# Patient Record
Sex: Male | Born: 1950
Health system: Southern US, Community
[De-identification: ages and names within clinical notes are randomized; demographics above are authoritative.]

## PROBLEM LIST (undated history)

## (undated) DIAGNOSIS — E119 Type 2 diabetes mellitus without complications: Secondary | ICD-10-CM

## (undated) DIAGNOSIS — I10 Essential (primary) hypertension: Secondary | ICD-10-CM

## (undated) DIAGNOSIS — Z8719 Personal history of other diseases of the digestive system: Secondary | ICD-10-CM

## (undated) DIAGNOSIS — D472 Monoclonal gammopathy: Secondary | ICD-10-CM

## (undated) HISTORY — DX: Monoclonal gammopathy: D47.2

## (undated) HISTORY — DX: Personal history of other diseases of the digestive system: Z87.19

## (undated) HISTORY — PX: TONSILLECTOMY: SUR1361

## (undated) HISTORY — DX: Essential (primary) hypertension: I10

## (undated) HISTORY — DX: Type 2 diabetes mellitus without complications: E11.9

---

## 1990-10-06 HISTORY — PX: APPENDECTOMY: SHX54

## 2004-10-06 HISTORY — PX: COLONOSCOPY: SHX5424

## 2006-08-26 ENCOUNTER — Ambulatory Visit: Payer: Self-pay | Admitting: Unknown Physician Specialty

## 2011-04-01 HISTORY — PX: OTHER SURGICAL HISTORY: SHX169

## 2013-01-04 LAB — HM COLONOSCOPY

## 2014-03-07 DIAGNOSIS — L309 Dermatitis, unspecified: Secondary | ICD-10-CM | POA: Insufficient documentation

## 2014-03-07 DIAGNOSIS — E119 Type 2 diabetes mellitus without complications: Secondary | ICD-10-CM | POA: Insufficient documentation

## 2014-03-07 DIAGNOSIS — I10 Essential (primary) hypertension: Secondary | ICD-10-CM | POA: Insufficient documentation

## 2014-03-07 DIAGNOSIS — E785 Hyperlipidemia, unspecified: Secondary | ICD-10-CM | POA: Insufficient documentation

## 2014-03-07 LAB — HEMOGLOBIN A1C: Hgb A1c MFr Bld: 6.4 % — AB (ref 4.0–6.0)

## 2014-03-07 LAB — BASIC METABOLIC PANEL
Creatinine: 1.5 mg/dL — AB (ref <=1.3)
GLUCOSE: 121 mg/dL

## 2014-06-08 LAB — PSA: PSA: 0.8

## 2014-06-08 LAB — LIPID PANEL
CHOLESTEROL: 142 mg/dL (ref 0–200)
HDL: 36 mg/dL (ref 35–70)
LDL Cholesterol: 82 mg/dL
TRIGLYCERIDES: 120 mg/dL (ref 40–160)

## 2015-02-02 DIAGNOSIS — E119 Type 2 diabetes mellitus without complications: Secondary | ICD-10-CM | POA: Insufficient documentation

## 2015-02-02 DIAGNOSIS — G479 Sleep disorder, unspecified: Secondary | ICD-10-CM | POA: Insufficient documentation

## 2015-02-02 DIAGNOSIS — Z0000 Encounter for general adult medical examination without abnormal findings: Secondary | ICD-10-CM | POA: Insufficient documentation

## 2015-02-02 DIAGNOSIS — E7849 Other hyperlipidemia: Secondary | ICD-10-CM | POA: Insufficient documentation

## 2015-02-02 DIAGNOSIS — I10 Essential (primary) hypertension: Secondary | ICD-10-CM | POA: Insufficient documentation

## 2015-05-17 ENCOUNTER — Ambulatory Visit (INDEPENDENT_AMBULATORY_CARE_PROVIDER_SITE_OTHER): Payer: BLUE CROSS/BLUE SHIELD | Admitting: Family Medicine

## 2015-05-17 ENCOUNTER — Encounter: Payer: Self-pay | Admitting: Family Medicine

## 2015-05-17 VITALS — BP 140/80 | HR 80 | Temp 99.2°F | Ht 70.0 in | Wt 202.0 lb

## 2015-05-17 DIAGNOSIS — N410 Acute prostatitis: Secondary | ICD-10-CM

## 2015-05-17 LAB — POCT URINALYSIS DIPSTICK
BILIRUBIN UA: NEGATIVE
Glucose, UA: NEGATIVE
Ketones, UA: NEGATIVE
NITRITE UA: POSITIVE
PH UA: 6
Spec Grav, UA: 1.015
Urobilinogen, UA: 0.2

## 2015-05-17 MED ORDER — CIPROFLOXACIN HCL 500 MG PO TABS: 500.0000 mg | ORAL_TABLET | Freq: Two times a day (BID) | ORAL | Status: DC

## 2015-05-17 NOTE — Progress Notes (Signed)
Name: Chris Blair.   MRN: [id number]    DOB: 11/25/1950   Date:05/17/2015       Progress Note  Subjective  Chief Complaint  Chief Complaint  Patient presents with  . Urinary Tract Infection    burns when urinate- no blood noticed x 1 day    Urinary Tract Infection  This is a recurrent problem. The current episode started yesterday. The problem has been gradually worsening. The quality of the pain is described as burning. The maximum temperature recorded prior to his arrival was 100 - 100.9 F. The fever has been present for 1 - 2 days. Associated symptoms include chills, frequency, hesitancy, sweats and urgency. Pertinent negatives include no discharge, flank pain, hematuria, nausea or vomiting. He has tried nothing for the symptoms. The treatment provided no relief. There is no history of catheterization or recurrent UTIs.    No problem-specific assessment & plan notes found for this encounter.   No past medical history on file.  Past Surgical History  Procedure Laterality Date  . Appendectomy  10/06/1990  . Partial colon removed- diverticular disease  04/01/2011    No family history on file.  Social History   Social History  . Marital Status: Married    Spouse Name: N/A  . Number of Children: N/A  . Years of Education: N/A   Occupational History  . Not on file.   Social History Main Topics  . Smoking status: Never Smoker   . Smokeless tobacco: Not on file  . Alcohol Use: 0.0 oz/week    0 Standard drinks or equivalent per week  . Drug Use: No  . Sexual Activity: Not on file   Other Topics Concern  . Not on file   Social History Narrative    Allergies  Allergen Reactions  . Penicillins      Review of Systems  Constitutional: Positive for chills. Negative for fever, weight loss and malaise/fatigue.  HENT: Negative for ear discharge, ear pain and sore throat.   Eyes: Negative for blurred vision.  Respiratory: Negative for cough, sputum production,  shortness of breath and wheezing.   Cardiovascular: Negative for chest pain, palpitations and leg swelling.  Gastrointestinal: Negative for heartburn, nausea, vomiting, abdominal pain, diarrhea, constipation, blood in stool and melena.  Genitourinary: Positive for dysuria, hesitancy, urgency and frequency. Negative for hematuria and flank pain.  Musculoskeletal: Negative for myalgias, back pain, joint pain and neck pain.  Skin: Negative for rash.  Neurological: Negative for dizziness, tingling, sensory change, focal weakness and headaches.  Endo/Heme/Allergies: Negative for environmental allergies and polydipsia. Does not bruise/bleed easily.  Psychiatric/Behavioral: Negative for depression and suicidal ideas. The patient is not nervous/anxious and does not have insomnia.      Objective  Filed Vitals:   05/17/15 1136  BP: 140/80  Pulse: 80  Temp: 99.2 F (37.3 C)  TempSrc: Oral  Height: 5\' 10"  (1.778 m)  Weight: 202 lb (91.627 kg)    Physical Exam  Constitutional: He is oriented to person, place, and time and well-developed, well-nourished, and in no distress.  HENT:  Head: Normocephalic.  Right Ear: External ear normal.  Left Ear: External ear normal.  Nose: Nose normal.  Mouth/Throat: Oropharynx is clear and moist.  Eyes: Conjunctivae and EOM are normal. Pupils are equal, round, and reactive to light. Right eye exhibits no discharge. Left eye exhibits no discharge. No scleral icterus.  Neck: Normal range of motion. Neck supple. No JVD present. No tracheal deviation present. No thyromegaly  present.  Cardiovascular: Normal rate, regular rhythm, normal heart sounds and intact distal pulses.  Exam reveals no gallop and no friction rub.   No murmur heard. Pulmonary/Chest: Breath sounds normal. No respiratory distress. He has no wheezes. He has no rales.  Abdominal: Soft. Bowel sounds are normal. He exhibits no mass. There is no hepatosplenomegaly. There is no tenderness. There is  no rebound, no guarding and no CVA tenderness.  Genitourinary: Rectum normal and testes/scrotum normal. Prostate is tender. Prostate is not enlarged.  Musculoskeletal: Normal range of motion. He exhibits no edema or tenderness.  Lymphadenopathy:    He has no cervical adenopathy.  Neurological: He is alert and oriented to person, place, and time. He has normal sensation, normal strength, normal reflexes and intact cranial nerves. No cranial nerve deficit.  Skin: Skin is warm. No rash noted.  Psychiatric: Mood and affect normal.      Assessment & Plan  Problem List Items Addressed This Visit    None        Dr. Otilio Miu [facility]  05/17/2015

## 2015-05-19 LAB — CULTURE, URINE COMPREHENSIVE

## 2015-06-21 ENCOUNTER — Encounter: Payer: Self-pay | Admitting: Family Medicine

## 2015-06-21 ENCOUNTER — Ambulatory Visit (INDEPENDENT_AMBULATORY_CARE_PROVIDER_SITE_OTHER): Payer: BLUE CROSS/BLUE SHIELD | Admitting: Family Medicine

## 2015-06-21 VITALS — BP 138/80 | HR 64 | Ht 72.0 in | Wt 221.0 lb

## 2015-06-21 DIAGNOSIS — G250 Essential tremor: Secondary | ICD-10-CM | POA: Diagnosis not present

## 2015-06-21 DIAGNOSIS — Z0000 Encounter for general adult medical examination without abnormal findings: Secondary | ICD-10-CM

## 2015-06-21 LAB — HEMOCCULT GUIAC POC 1CARD (OFFICE): Fecal Occult Blood, POC: NEGATIVE

## 2015-06-21 NOTE — Patient Instructions (Signed)

## 2015-06-21 NOTE — Progress Notes (Signed)
Name: Chris Blair.   MRN: [id number]    DOB: Mar 25, 1951   Date:06/21/2015       Progress Note  Subjective  Chief Complaint  Chief Complaint  Patient presents with  . Annual Exam    HPI Comments: Annual physical exam with no subjective or objective concerns other than noted tremor.  Neurologic Problem The patient's pertinent negatives include no clumsiness, focal sensory loss, focal weakness, loss of balance, memory loss, near-syncope or slurred speech. Primary symptoms comment: tremor. This is a chronic problem. The current episode started more than 1 year ago. The neurological problem developed gradually. The problem has been rapidly worsening since onset. There was facial (bilateral upper extremety) focality noted. Pertinent negatives include no abdominal pain, back pain, bladder incontinence, bowel incontinence, chest pain, dizziness, fever, headaches, light-headedness, nausea, neck pain, palpitations or shortness of breath. Past treatments include nothing.    No problem-specific assessment & plan notes found for this encounter.   Past Medical History  Diagnosis Date  . Diabetes mellitus without complication   . Hypertension     Past Surgical History  Procedure Laterality Date  . Appendectomy  10/06/1990  . Partial colon removed- diverticular disease  04/01/2011  . Colonoscopy  2006    Dr Vira Agar    Family History  Problem Relation Age of Onset  . Cancer Father   . Heart disease Maternal Grandmother     Social History   Social History  . Marital Status: Married    Spouse Name: N/A  . Number of Children: N/A  . Years of Education: N/A   Occupational History  . Not on file.   Social History Main Topics  . Smoking status: Never Smoker   . Smokeless tobacco: Not on file  . Alcohol Use: 0.0 oz/week    0 Standard drinks or equivalent per week  . Drug Use: No  . Sexual Activity: Yes   Other Topics Concern  . Not on file   Social History Narrative     Allergies  Allergen Reactions  . Penicillins      Review of Systems  Constitutional: Negative for fever, chills, weight loss and malaise/fatigue.  HENT: Negative for ear discharge, ear pain and sore throat.   Eyes: Negative for blurred vision.  Respiratory: Negative for cough, sputum production, shortness of breath and wheezing.   Cardiovascular: Negative for chest pain, palpitations, leg swelling and near-syncope.  Gastrointestinal: Negative for heartburn, nausea, abdominal pain, diarrhea, constipation, blood in stool, melena and bowel incontinence.  Genitourinary: Negative for bladder incontinence, dysuria, urgency, frequency and hematuria.  Musculoskeletal: Negative for myalgias, back pain, joint pain and neck pain.  Skin: Negative for rash.  Neurological: Positive for tremors. Negative for dizziness, tingling, sensory change, focal weakness, light-headedness, headaches and loss of balance.  Endo/Heme/Allergies: Negative for environmental allergies and polydipsia. Does not bruise/bleed easily.  Psychiatric/Behavioral: Negative for depression, suicidal ideas and memory loss. The patient is not nervous/anxious and does not have insomnia.      Objective  Filed Vitals:   06/21/15 0948  BP: 138/80  Pulse: 64  Height: 6' (1.829 m)  Weight: 221 lb (100.245 kg)    Physical Exam  Constitutional: He is oriented to person, place, and time and well-developed, well-nourished, and in no distress.  HENT:  Head: Normocephalic.  Right Ear: External ear normal.  Left Ear: External ear normal.  Nose: Nose normal.  Mouth/Throat: Oropharynx is clear and moist.  Eyes: Conjunctivae and EOM are normal. Pupils are equal,  round, and reactive to light. Right eye exhibits no discharge. Left eye exhibits no discharge. No scleral icterus.  Neck: Normal range of motion. Neck supple. No JVD present. No tracheal deviation present. No thyromegaly present.  Cardiovascular: Normal rate, regular  rhythm, normal heart sounds and intact distal pulses.  Exam reveals no gallop and no friction rub.   No murmur heard. Pulmonary/Chest: Breath sounds normal. No respiratory distress. He has no wheezes. He has no rales.  Abdominal: Soft. Bowel sounds are normal. He exhibits no mass. There is no hepatosplenomegaly. There is no tenderness. There is no rebound, no guarding and no CVA tenderness.  Genitourinary: Rectum normal, prostate normal and penis normal. Guaiac negative stool.  Musculoskeletal: Normal range of motion. He exhibits no edema or tenderness.  Lymphadenopathy:    He has no cervical adenopathy.  Neurological: He is alert and oriented to person, place, and time. He has normal sensation, normal strength, normal reflexes and intact cranial nerves. No cranial nerve deficit. Coordination normal.  Fine tremor  Skin: Skin is warm. No rash noted.  Psychiatric: Mood and affect normal.      Assessment & Plan  Problem List Items Addressed This Visit    None    Visit Diagnoses    Annual physical exam    -  Primary    Relevant Orders    PSA    Essential tremor        Relevant Orders    Ambulatory referral to Neurology         Dr. Otilio Miu [facility] [place] [facility]  06/21/2015

## 2015-06-22 LAB — PSA: PROSTATE SPECIFIC AG, SERUM: 2.4 ng/mL (ref 0.0–4.0)

## 2016-01-17 ENCOUNTER — Other Ambulatory Visit: Payer: Self-pay

## 2016-06-24 ENCOUNTER — Encounter: Payer: Self-pay | Admitting: Family Medicine

## 2016-06-24 ENCOUNTER — Ambulatory Visit (INDEPENDENT_AMBULATORY_CARE_PROVIDER_SITE_OTHER): Payer: Managed Care, Other (non HMO) | Admitting: Family Medicine

## 2016-06-24 VITALS — BP 150/98 | HR 80 | Ht 72.0 in | Wt 215.0 lb

## 2016-06-24 DIAGNOSIS — E663 Overweight: Secondary | ICD-10-CM

## 2016-06-24 DIAGNOSIS — R351 Nocturia: Secondary | ICD-10-CM

## 2016-06-24 DIAGNOSIS — R69 Illness, unspecified: Secondary | ICD-10-CM | POA: Diagnosis not present

## 2016-06-24 DIAGNOSIS — Z0000 Encounter for general adult medical examination without abnormal findings: Secondary | ICD-10-CM

## 2016-06-24 DIAGNOSIS — Z1211 Encounter for screening for malignant neoplasm of colon: Secondary | ICD-10-CM

## 2016-06-24 DIAGNOSIS — I10 Essential (primary) hypertension: Secondary | ICD-10-CM | POA: Diagnosis not present

## 2016-06-24 LAB — HEMOCCULT GUIAC POC 1CARD (OFFICE): Fecal Occult Blood, POC: NEGATIVE

## 2016-06-24 MED ORDER — AMLODIPINE BESYLATE 5 MG PO TABS: 5.0000 mg | ORAL_TABLET | Freq: Every day | ORAL | 1 refills | Status: DC

## 2016-06-24 MED ORDER — LISINOPRIL 10 MG PO TABS: 10.0000 mg | ORAL_TABLET | Freq: Every day | ORAL | 1 refills | Status: DC

## 2016-06-24 NOTE — Progress Notes (Signed)
Name: Chris Blair.   MRN: [id number]    DOB: [birth year]   Date:06/24/2016       Progress Note  Subjective  Chief Complaint  Chief Complaint  Patient presents with  . Annual Exam  . Hypertension    Hypertension  This is a chronic problem. The current episode started more than 1 year ago. The problem has been waxing and waning since onset. The problem is controlled. Pertinent negatives include no anxiety, blurred vision, chest pain, headaches, malaise/fatigue, neck pain, orthopnea, palpitations, peripheral edema, PND, shortness of breath or sweats. There are no associated agents to hypertension. Risk factors for coronary artery disease include diabetes mellitus and dyslipidemia. Past treatments include ACE inhibitors and calcium channel blockers. The current treatment provides moderate improvement. There are no compliance problems.  There is no history of angina, kidney disease, CAD/MI, CVA, heart failure, left ventricular hypertrophy, PVD, renovascular disease or retinopathy. There is no history of chronic renal disease or a hypertension causing med.    No problem-specific Assessment & Plan notes found for this encounter.   Past Medical History:  Diagnosis Date  . Diabetes mellitus without complication ([place])   . Hypertension     Past Surgical History:  Procedure Laterality Date  . APPENDECTOMY  10/06/1990  . COLONOSCOPY  2006   Dr Vira Agar  . partial colon removed- diverticular disease  04/01/2011    Family History  Problem Relation Age of Onset  . Cancer Father   . Heart disease Maternal Grandmother     Social History   Social History  . Marital status: Married    Spouse name: N/A  . Number of children: N/A  . Years of education: N/A   Occupational History  . Not on file.   Social History Main Topics  . Smoking status: Never Smoker  . Smokeless tobacco: Not on file  . Alcohol use 0.0 oz/week  . Drug use: No  . Sexual activity: Yes   Other Topics Concern  .  Not on file   Social History Narrative  . No narrative on file    Allergies  Allergen Reactions  . Penicillins      Review of Systems  Constitutional: Negative for chills, fever, malaise/fatigue and weight loss.  HENT: Negative for ear discharge, ear pain and sore throat.   Eyes: Negative for blurred vision.  Respiratory: Negative for cough, sputum production, shortness of breath and wheezing.   Cardiovascular: Negative for chest pain, palpitations, orthopnea, leg swelling and PND.  Gastrointestinal: Negative for abdominal pain, blood in stool, constipation, diarrhea, heartburn, melena and nausea.  Genitourinary: Negative for dysuria, frequency, hematuria and urgency.  Musculoskeletal: Negative for back pain, joint pain, myalgias and neck pain.  Skin: Negative for rash.  Neurological: Negative for dizziness, tingling, sensory change, focal weakness and headaches.  Endo/Heme/Allergies: Negative for environmental allergies and polydipsia. Does not bruise/bleed easily.  Psychiatric/Behavioral: Negative for depression and suicidal ideas. The patient is not nervous/anxious and does not have insomnia.      Objective  Vitals:   06/24/16 0831  BP: (!) 150/98  Pulse: 80  Weight: 215 lb (97.5 kg)  Height: 6' (1.829 m)    Physical Exam  Constitutional: He is oriented to person, place, and time and well-developed, well-nourished, and in no distress.  HENT:  Head: Normocephalic.  Right Ear: Tympanic membrane, external ear and ear canal normal.  Left Ear: Tympanic membrane, external ear and ear canal normal.  Nose: Nose normal.  Mouth/Throat: Uvula is midline,  oropharynx is clear and moist and mucous membranes are normal.  Eyes: Conjunctivae and EOM are normal. Right eye exhibits no discharge. Left eye exhibits no discharge. No scleral icterus.  Fundoscopic exam:      The right eye shows no arteriolar narrowing, no AV nicking and no papilledema.       The left eye shows no  arteriolar narrowing, no AV nicking and no papilledema.  Neck: Normal range of motion. Neck supple. Normal carotid pulses, no hepatojugular reflux and no JVD present. Carotid bruit is not present. No tracheal deviation present. No thyromegaly present.  Cardiovascular: Normal rate, regular rhythm, S1 normal, S2 normal, normal heart sounds and intact distal pulses.  PMI is not displaced.  Exam reveals no gallop, no S3, no S4, no distant heart sounds and no friction rub.   No murmur heard. Pulmonary/Chest: Breath sounds normal. No respiratory distress. He has no wheezes. He has no rales. Right breast exhibits no mass. Left breast exhibits no mass.  Abdominal: Soft. Bowel sounds are normal. He exhibits no mass. There is no hepatosplenomegaly. There is no tenderness. There is no rebound, no guarding and no CVA tenderness.  Genitourinary: Rectum normal, prostate normal and testes/scrotum normal.  Musculoskeletal: Normal range of motion. He exhibits no edema or tenderness.       Lumbar back: He exhibits spasm.  Lymphadenopathy:       Head (right side): No submental and no submandibular adenopathy present.       Head (left side): No submental and no submandibular adenopathy present.    He has no cervical adenopathy.    He has no axillary adenopathy.  Neurological: He is alert and oriented to person, place, and time. He has normal motor skills, normal sensation, normal strength, normal reflexes and intact cranial nerves. No cranial nerve deficit. He has a normal Straight Leg Raise Test. Gait normal.  Skin: Skin is warm, dry and intact. No rash noted.  Psychiatric: Mood and affect normal.  Nursing note and vitals reviewed.     Assessment & Plan  Problem List Items Addressed This Visit      Cardiovascular and Mediastinum   BP (high blood pressure)   Relevant Medications   amLODipine (NORVASC) 5 MG tablet   lisinopril (PRINIVIL,ZESTRIL) 10 MG tablet    Other Visit Diagnoses    Annual physical  exam    -  Primary   Relevant Orders   POCT Occult Blood Stool (Completed)   Nocturia       Relevant Orders   PSA   Taking medication for chronic disease       Overweight       Relevant Orders   Lipid Profile   Colon cancer screening       Relevant Orders   POCT Occult Blood Stool (Completed)        Dr. Otilio Miu [facility]  06/24/16

## 2016-06-25 LAB — LIPID PANEL
CHOL/HDL RATIO: 3.9 ratio (ref 0.0–5.0)
Cholesterol, Total: 159 mg/dL (ref 100–199)
HDL: 41 mg/dL (ref >39)
LDL CALC: 94 mg/dL (ref 0–99)
TRIGLYCERIDES: 121 mg/dL (ref 0–149)
VLDL CHOLESTEROL CAL: 24 mg/dL (ref 5–40)

## 2016-06-25 LAB — PSA: Prostate Specific Ag, Serum: 1.2 ng/mL (ref 0.0–4.0)

## 2016-11-21 ENCOUNTER — Ambulatory Visit: Admit: 2016-11-21 | Payer: Self-pay | Admitting: Unknown Physician Specialty

## 2016-11-21 SURGERY — COLONOSCOPY WITH PROPOFOL
Anesthesia: General

## 2017-01-14 ENCOUNTER — Other Ambulatory Visit: Payer: Self-pay | Admitting: Family Medicine

## 2017-01-14 DIAGNOSIS — I10 Essential (primary) hypertension: Secondary | ICD-10-CM

## 2017-03-10 ENCOUNTER — Other Ambulatory Visit: Payer: Self-pay | Admitting: Family Medicine

## 2017-03-10 DIAGNOSIS — I10 Essential (primary) hypertension: Secondary | ICD-10-CM

## 2017-04-13 ENCOUNTER — Telehealth: Payer: Self-pay

## 2017-04-13 NOTE — Telephone Encounter (Deleted)
Patient advised to go to ER as he left message about temp 102. I called and his temp is lower 100.3 now. He got Abx in him this afternoon and feels less feverish. He will keep eye on temp and call us tomorrow to check in. If temp goes up again he will go to ER.

## 2017-04-13 NOTE — Telephone Encounter (Signed)
Patient at [facility] now as he left message stating that [facility] wanted to wait till 04/20/17 to do CT. I called per Ronnald Ramp to advise ER and he was already there.

## 2017-04-13 NOTE — Telephone Encounter (Deleted)
Left message stating that THEY want to wait until Monday to do this CT. I think it is the hospital that is wanting to push this out but call patient to see.

## 2017-04-21 ENCOUNTER — Other Ambulatory Visit: Payer: Self-pay | Admitting: Family Medicine

## 2017-04-21 DIAGNOSIS — I10 Essential (primary) hypertension: Secondary | ICD-10-CM

## 2017-05-28 ENCOUNTER — Other Ambulatory Visit: Payer: Self-pay | Admitting: Family Medicine

## 2017-05-28 DIAGNOSIS — I10 Essential (primary) hypertension: Secondary | ICD-10-CM

## 2017-06-25 ENCOUNTER — Encounter: Payer: Self-pay | Admitting: Family Medicine

## 2017-06-25 ENCOUNTER — Ambulatory Visit (INDEPENDENT_AMBULATORY_CARE_PROVIDER_SITE_OTHER): Payer: BLUE CROSS/BLUE SHIELD | Admitting: Family Medicine

## 2017-06-25 VITALS — BP 130/80 | HR 60 | Ht 72.0 in | Wt 217.0 lb

## 2017-06-25 DIAGNOSIS — I10 Essential (primary) hypertension: Secondary | ICD-10-CM | POA: Diagnosis not present

## 2017-06-25 DIAGNOSIS — Z1211 Encounter for screening for malignant neoplasm of colon: Secondary | ICD-10-CM

## 2017-06-25 DIAGNOSIS — M65312 Trigger thumb, left thumb: Secondary | ICD-10-CM | POA: Diagnosis not present

## 2017-06-25 DIAGNOSIS — E785 Hyperlipidemia, unspecified: Secondary | ICD-10-CM

## 2017-06-25 DIAGNOSIS — Z0000 Encounter for general adult medical examination without abnormal findings: Secondary | ICD-10-CM | POA: Diagnosis not present

## 2017-06-25 DIAGNOSIS — Z23 Encounter for immunization: Secondary | ICD-10-CM

## 2017-06-25 LAB — HEMOCCULT GUIAC POC 1CARD (OFFICE): Fecal Occult Blood, POC: NEGATIVE

## 2017-06-25 MED ORDER — LISINOPRIL 10 MG PO TABS: 10.0000 mg | ORAL_TABLET | Freq: Every day | ORAL | 1 refills | Status: DC

## 2017-06-25 MED ORDER — AMLODIPINE BESYLATE 5 MG PO TABS: 5.0000 mg | ORAL_TABLET | Freq: Every day | ORAL | 1 refills | Status: DC

## 2017-06-25 NOTE — Progress Notes (Signed)
Name: Chris Blair.   MRN: [id number]    DOB: 18-May-1951   Date:06/25/2017       Progress Note  Subjective  Chief Complaint  Chief Complaint  Patient presents with  . Annual Exam  . Hypertension    Patient presents for annual physical exam   Hypertension  This is a chronic problem. The current episode started more than 1 year ago. The problem has been gradually worsening since onset. The problem is controlled. Pertinent negatives include no anxiety, blurred vision, chest pain, headaches, malaise/fatigue, neck pain, orthopnea, palpitations, peripheral edema, PND, shortness of breath or sweats. There are no associated agents to hypertension. Risk factors for coronary artery disease include dyslipidemia and obesity. Past treatments include calcium channel blockers and ACE inhibitors. The current treatment provides moderate improvement. There are no compliance problems.  There is no history of angina, kidney disease, CAD/MI, CVA, heart failure, left ventricular hypertrophy, PVD or retinopathy. There is no history of chronic renal disease, a hypertension causing med or renovascular disease.    No problem-specific Assessment & Plan notes found for this encounter.   Past Medical History:  Diagnosis Date  . Diabetes mellitus without complication ([place])   . Hypertension     Past Surgical History:  Procedure Laterality Date  . APPENDECTOMY  10/06/1990  . COLONOSCOPY  2006   Dr Vira Agar  . partial colon removed- diverticular disease  04/01/2011    Family History  Problem Relation Age of Onset  . Cancer Father   . Heart disease Maternal Grandmother     Social History   Social History  . Marital status: Married    Spouse name: N/A  . Number of children: N/A  . Years of education: N/A   Occupational History  . Not on file.   Social History Main Topics  . Smoking status: Never Smoker  . Smokeless tobacco: Never Used  . Alcohol use 0.0 oz/week  . Drug use: No  . Sexual  activity: Yes   Other Topics Concern  . Not on file   Social History Narrative  . No narrative on file    Allergies  Allergen Reactions  . Penicillins     Outpatient Medications Prior to Visit  Medication Sig Dispense Refill  . glucagon (GLUCAGON EMERGENCY) 1 MG injection Inject as directed as directed.    Marland Kitchen glucose blood test strip 1 application daily.    . Insulin Detemir (LEVEMIR FLEXTOUCH) 100 UNIT/ML Pen Inject 30 Units into the skin at bedtime.     . Insulin Pen Needle (BD ULTRA-FINE PEN NEEDLES) 29G X 12.7MM MISC Use as directed. Use 1 needle to skin twice a day as directed [Use with Levemir and Victoza Pens]    . Insulin Pen Needle (NOVOFINE) 32G X 6 MM MISC USE ONE NEEDLE TWICE DAILY AS DIRECTED    . Liraglutide (VICTOZA) 18 MG/3ML SOPN Inject 1.8 Units into the skin daily at 6 (six) AM.    . metFORMIN (GLUCOPHAGE) 500 MG tablet Take 500 mg by mouth 2 (two) times daily with a meal.    . amLODipine (NORVASC) 5 MG tablet TAKE 1 TABLET BY MOUTH ONCE DAILY 30 tablet 0  . lisinopril (PRINIVIL,ZESTRIL) 10 MG tablet TAKE ONE TABLET BY MOUTH ONCE DAILY 90 tablet 0   No facility-administered medications prior to visit.     Review of Systems  Constitutional: Negative for chills, fever, malaise/fatigue and weight loss.  HENT: Negative for ear discharge, ear pain and sore throat.  Eyes: Negative for blurred vision.  Respiratory: Negative for cough, sputum production, shortness of breath and wheezing.   Cardiovascular: Negative for chest pain, palpitations, orthopnea, leg swelling and PND.  Gastrointestinal: Negative for abdominal pain, blood in stool, constipation, diarrhea, heartburn, melena and nausea.  Genitourinary: Negative for dysuria, frequency, hematuria and urgency.  Musculoskeletal: Positive for joint pain. Negative for back pain, myalgias and neck pain.  Skin: Negative for rash.  Neurological: Negative for dizziness, tingling, sensory change, focal weakness and  headaches.  Endo/Heme/Allergies: Negative for environmental allergies and polydipsia. Does not bruise/bleed easily.  Psychiatric/Behavioral: Negative for depression and suicidal ideas. The patient is not nervous/anxious and does not have insomnia.      Objective  Vitals:   06/25/17 0955  BP: 130/80  Pulse: 60  Weight: 217 lb (98.4 kg)  Height: 6' (1.829 m)    Physical Exam  Constitutional: He is oriented to person, place, and time and well-developed, well-nourished, and in no distress. Vital signs are normal.  HENT:  Head: Normocephalic and atraumatic.  Right Ear: Tympanic membrane, external ear and ear canal normal. Decreased hearing is noted.  Left Ear: Tympanic membrane, external ear and ear canal normal. Decreased hearing is noted.  Nose: Nose normal.  Mouth/Throat: Uvula is midline, oropharynx is clear and moist and mucous membranes are normal. Mucous membranes are not pale and not dry. Normal dentition. No oropharyngeal exudate, posterior oropharyngeal edema, posterior oropharyngeal erythema or tonsillar abscesses.  Eyes: Pupils are equal, round, and reactive to light. Conjunctivae, EOM and lids are normal. Right eye exhibits no discharge. Left eye exhibits no discharge. No scleral icterus.  Fundoscopic exam:      The right eye shows no arteriolar narrowing, no AV nicking and no papilledema.       The left eye shows no arteriolar narrowing, no AV nicking and no papilledema.  Neck: Normal range of motion. Neck supple. Normal carotid pulses, no hepatojugular reflux and no JVD present. Carotid bruit is not present. No tracheal deviation present. No thyromegaly present.  Cardiovascular: Normal rate, regular rhythm, S1 normal, S2 normal, normal heart sounds and intact distal pulses.  PMI is not displaced.  Exam reveals no gallop, no S3, no S4 and no friction rub.   No murmur heard. Pulmonary/Chest: Effort normal and breath sounds normal. No respiratory distress. He has no wheezes. He  has no rales. Right breast exhibits no mass. Left breast exhibits no mass.  Abdominal: Soft. Normal aorta and bowel sounds are normal. He exhibits no mass. There is no hepatosplenomegaly, splenomegaly or hepatomegaly. There is no tenderness. There is no rebound, no guarding and no CVA tenderness.  Genitourinary: Rectum normal, prostate normal, testes/scrotum normal and penis normal. Rectal exam shows no external hemorrhoid. Prostate is not enlarged and not tender.  Musculoskeletal: He exhibits no edema or tenderness.       Left hand: He exhibits decreased range of motion.  Trigger thumb left  Lymphadenopathy:    He has no cervical adenopathy.    He has no axillary adenopathy.  Neurological: He is alert and oriented to person, place, and time. He has normal motor skills, normal sensation, normal strength, normal reflexes and intact cranial nerves. No cranial nerve deficit.  Skin: Skin is warm and intact. No rash noted. He is not diaphoretic. No cyanosis. No pallor. Nails show no clubbing.  Psychiatric: Mood and affect normal.  Nursing note and vitals reviewed.     Assessment & Plan  Problem List Items Addressed This Visit  Cardiovascular and Mediastinum   BP (high blood pressure)   Relevant Medications   amLODipine (NORVASC) 5 MG tablet   lisinopril (PRINIVIL,ZESTRIL) 10 MG tablet   Other Relevant Orders   Renal Function Panel   PSA     Other   HLD (hyperlipidemia)   Relevant Medications   amLODipine (NORVASC) 5 MG tablet   lisinopril (PRINIVIL,ZESTRIL) 10 MG tablet   Other Relevant Orders   Renal Function Panel   Lipid Profile    Other Visit Diagnoses    Annual physical exam    -  Primary   Relevant Orders   Renal Function Panel   PSA   Trigger thumb, left thumb       Relevant Orders   Ambulatory referral to Orthopedic Surgery   Colon cancer screening       Relevant Orders   POCT occult blood stool   Influenza vaccine needed       Relevant Orders   Flu Vaccine  QUAD 36+ mos IM (Completed)      Meds ordered this encounter  Medications  . amLODipine (NORVASC) 5 MG tablet    Sig: Take 1 tablet (5 mg total) by mouth daily.    Dispense:  90 tablet    Refill:  1    Please consider 90 day supplies to promote better adherence  . lisinopril (PRINIVIL,ZESTRIL) 10 MG tablet    Sig: Take 1 tablet (10 mg total) by mouth daily.    Dispense:  90 tablet    Refill:  1      Dr. Otilio Miu [facility]  06/25/17

## 2017-06-25 NOTE — Patient Instructions (Signed)
Trigger Finger Trigger finger (stenosing tenosynovitis) is a condition that causes a finger to get stuck in a bent position. Each finger has a tough, cord-like tissue that connects muscle to bone (tendon), and each tendon is surrounded by a tunnel of tissue (tendon sheath). To move your finger, your tendon needs to slide freely through the sheath. Trigger finger happens when the tendon or the sheath thickens, making it difficult to move your finger. Trigger finger can affect any finger or a thumb. It may affect more than one finger. Mild cases may clear up with rest and medicine. Severe cases require more treatment. What are the causes? Trigger finger is caused by a thickened finger tendon or tendon sheath. The cause of this thickening is not known. What increases the risk? The following factors may make you more likely to develop this condition:  Doing activities that require a strong grip.  Having rheumatoid arthritis, gout, or diabetes.  Being 40-60 years old.  Being a woman.  What are the signs or symptoms? Symptoms of this condition include:  Pain when bending or straightening your finger.  Tenderness or swelling where your finger attaches to the palm of your hand.  A lump in the palm of your hand or on the inside of your finger.  Hearing a popping sound when you try to straighten your finger.  Feeling a popping, catching, or locking sensation when you try to straighten your finger.  Being unable to straighten your finger.  How is this diagnosed? This condition is diagnosed based on your symptoms and a physical exam. How is this treated? This condition may be treated by:  Resting your finger and avoiding activities that make symptoms worse.  Wearing a finger splint to keep your finger in a slightly bent position.  Taking NSAIDs to relieve pain and swelling.  Injecting medicine (steroids) into the tendon sheath to reduce swelling and irritation. Injections may need to be  repeated.  Having surgery to open the tendon sheath. This may be done if other treatments do not work and you cannot straighten your finger. You may need physical therapy after surgery.  Follow these instructions at home:  Use moist heat to help reduce pain and swelling as told by your health care provider.  Rest your finger and avoid activities that make pain worse. Return to normal activities as told by your health care provider.  If you have a splint, wear it as told by your health care provider.  Take over-the-counter and prescription medicines only as told by your health care provider.  Keep all follow-up visits as told by your health care provider. This is important. Contact a health care provider if:  Your symptoms are not improving with home care. Summary  Trigger finger (stenosing tenosynovitis) causes your finger to get stuck in a bent position, and it can make it difficult and painful to straighten your finger.  This condition develops when a finger tendon or tendon sheath thickens.  Treatment starts with resting, wearing a splint, and taking NSAIDs.  In severe cases, surgery to open the tendon sheath may be needed. This information is not intended to replace advice given to you by your health care provider. Make sure you discuss any questions you have with your health care provider. Document Released: 07/12/2004 Document Revised: 09/02/2016 Document Reviewed: 09/02/2016 Elsevier Interactive Patient Education  2017 Elsevier Inc.  

## 2017-06-26 ENCOUNTER — Other Ambulatory Visit: Payer: Self-pay

## 2017-06-26 DIAGNOSIS — R7989 Other specified abnormal findings of blood chemistry: Secondary | ICD-10-CM

## 2017-06-26 LAB — LIPID PANEL
CHOL/HDL RATIO: 4.2 ratio (ref 0.0–5.0)
CHOLESTEROL TOTAL: 150 mg/dL (ref 100–199)
HDL: 36 mg/dL — AB (ref >39)
LDL Calculated: 88 mg/dL (ref 0–99)
TRIGLYCERIDES: 129 mg/dL (ref 0–149)
VLDL Cholesterol Cal: 26 mg/dL (ref 5–40)

## 2017-06-26 LAB — RENAL FUNCTION PANEL
Albumin: 4.4 g/dL (ref 3.6–4.8)
BUN / CREAT RATIO: 14 (ref 10–24)
BUN: 25 mg/dL (ref 8–27)
CO2: 18 mmol/L — ABNORMAL LOW (ref 20–29)
Calcium: 10.4 mg/dL — ABNORMAL HIGH (ref 8.6–10.2)
Chloride: 106 mmol/L (ref 96–106)
Creatinine, Ser: 1.84 mg/dL — ABNORMAL HIGH (ref 0.76–1.27)
GFR calc Af Amer: 44 mL/min/{1.73_m2} — ABNORMAL LOW (ref >59)
GFR, EST NON AFRICAN AMERICAN: 38 mL/min/{1.73_m2} — AB (ref >59)
Glucose: 129 mg/dL — ABNORMAL HIGH (ref 65–99)
Phosphorus: 4 mg/dL (ref 2.5–4.5)
Potassium: 5.3 mmol/L — ABNORMAL HIGH (ref 3.5–5.2)
SODIUM: 139 mmol/L (ref 134–144)

## 2017-06-26 LAB — PSA: Prostate Specific Ag, Serum: 1.2 ng/mL (ref 0.0–4.0)

## 2017-06-30 ENCOUNTER — Other Ambulatory Visit: Payer: Self-pay

## 2017-08-24 ENCOUNTER — Other Ambulatory Visit: Payer: Self-pay

## 2017-08-24 ENCOUNTER — Inpatient Hospital Stay: Payer: BLUE CROSS/BLUE SHIELD

## 2017-08-24 ENCOUNTER — Inpatient Hospital Stay: Payer: BLUE CROSS/BLUE SHIELD | Attending: Oncology | Admitting: Oncology

## 2017-08-24 ENCOUNTER — Encounter: Payer: Self-pay | Admitting: Oncology

## 2017-08-24 VITALS — BP 170/76 | HR 76 | Temp 97.0°F | Wt 214.1 lb

## 2017-08-24 DIAGNOSIS — I129 Hypertensive chronic kidney disease with stage 1 through stage 4 chronic kidney disease, or unspecified chronic kidney disease: Secondary | ICD-10-CM | POA: Diagnosis not present

## 2017-08-24 DIAGNOSIS — Z880 Allergy status to penicillin: Secondary | ICD-10-CM | POA: Diagnosis not present

## 2017-08-24 DIAGNOSIS — Z79899 Other long term (current) drug therapy: Secondary | ICD-10-CM | POA: Diagnosis not present

## 2017-08-24 DIAGNOSIS — E1122 Type 2 diabetes mellitus with diabetic chronic kidney disease: Secondary | ICD-10-CM | POA: Insufficient documentation

## 2017-08-24 DIAGNOSIS — N189 Chronic kidney disease, unspecified: Secondary | ICD-10-CM

## 2017-08-24 DIAGNOSIS — Z794 Long term (current) use of insulin: Secondary | ICD-10-CM | POA: Insufficient documentation

## 2017-08-24 DIAGNOSIS — D472 Monoclonal gammopathy: Secondary | ICD-10-CM

## 2017-08-24 DIAGNOSIS — D649 Anemia, unspecified: Secondary | ICD-10-CM | POA: Insufficient documentation

## 2017-08-24 DIAGNOSIS — Z87891 Personal history of nicotine dependence: Secondary | ICD-10-CM

## 2017-08-24 LAB — CBC WITH DIFFERENTIAL/PLATELET
BASOS ABS: 0.1 10*3/uL (ref 0–0.1)
BASOS PCT: 1 %
EOS ABS: 0.1 10*3/uL (ref 0–0.7)
EOS PCT: 2 %
HCT: 37.2 % — ABNORMAL LOW (ref 40.0–52.0)
Hemoglobin: 12.9 g/dL — ABNORMAL LOW (ref 13.0–18.0)
LYMPHS PCT: 27 %
Lymphs Abs: 2 10*3/uL (ref 1.0–3.6)
MCH: 33 pg (ref 26.0–34.0)
MCHC: 34.7 g/dL (ref 32.0–36.0)
MCV: 95.2 fL (ref 80.0–100.0)
MONO ABS: 0.7 10*3/uL (ref 0.2–1.0)
Monocytes Relative: 9 %
Neutro Abs: 4.6 10*3/uL (ref 1.4–6.5)
Neutrophils Relative %: 61 %
PLATELETS: 215 10*3/uL (ref 150–440)
RBC: 3.91 MIL/uL — ABNORMAL LOW (ref 4.40–5.90)
RDW: 12.7 % (ref 11.5–14.5)
WBC: 7.4 10*3/uL (ref 3.8–10.6)

## 2017-08-24 LAB — COMPREHENSIVE METABOLIC PANEL
ALBUMIN: 4.3 g/dL (ref 3.5–5.0)
ALK PHOS: 43 U/L (ref 38–126)
ALT: 25 U/L (ref 17–63)
AST: 23 U/L (ref 15–41)
Anion gap: 9 (ref 5–15)
BILIRUBIN TOTAL: 1 mg/dL (ref 0.3–1.2)
BUN: 30 mg/dL — AB (ref 6–20)
CALCIUM: 9.3 mg/dL (ref 8.9–10.3)
CO2: 19 mmol/L — ABNORMAL LOW (ref 22–32)
CREATININE: 2.02 mg/dL — AB (ref 0.61–1.24)
Chloride: 107 mmol/L (ref 101–111)
GFR calc Af Amer: 38 mL/min — ABNORMAL LOW (ref >60)
GFR, EST NON AFRICAN AMERICAN: 33 mL/min — AB (ref >60)
GLUCOSE: 129 mg/dL — AB (ref 65–99)
Potassium: 4 mmol/L (ref 3.5–5.1)
Sodium: 135 mmol/L (ref 135–145)
TOTAL PROTEIN: 7.6 g/dL (ref 6.5–8.1)

## 2017-08-24 LAB — IRON AND TIBC
IRON: 96 ug/dL (ref 45–182)
Saturation Ratios: 27 % (ref 17.9–39.5)
TIBC: 352 ug/dL (ref 250–450)
UIBC: 256 ug/dL

## 2017-08-24 LAB — FOLATE: Folate: 38 ng/mL (ref >5.9)

## 2017-08-24 LAB — LACTATE DEHYDROGENASE: LDH: 127 U/L (ref 98–192)

## 2017-08-24 LAB — FERRITIN: FERRITIN: 89 ng/mL (ref 24–336)

## 2017-08-24 NOTE — Progress Notes (Signed)
Patient here today as a new patient  

## 2017-08-24 NOTE — Progress Notes (Signed)
Hematology/Oncology Consult note [facility] Telephone:([phone] [phone]   Patient Care Team: Juline Patch, MD as PCP - General (Family Medicine)  REFERRING PROVIDER: Dr.Lateef CHIEF COMPLAINTS/PURPOSE OF CONSULTATION:  Abnormal SPEP,   HISTORY OF PRESENTING ILLNESS:  Chris Blair. is a  66 y.o.  male with PMH listed below who was referred to me for evaluation of abnormal SPEP. Patient reports feeling well at his baseline. He recently had labs done with primary care physician and was noticed to have chronic kidney disease. patient was referred to see a nephrologist Dr. Holley Raring who did an additional test. Lab records from nephrologist office was reviewed. Was noted that serum protein electrophoresis showed M spike of 0.5.  Urine electrophoresis no M spike was detected. Patient denies any bone pain. He denies any neuropathy.    Review of Systems  Constitutional: Negative for chills and fever.  HENT: Negative for hearing loss.   Eyes: Negative for blurred vision.  Cardiovascular: Negative for chest pain.  Gastrointestinal: Negative for heartburn.  Genitourinary: Negative for dysuria.  Musculoskeletal: Negative for myalgias.  Skin: Negative for rash.  Neurological: Negative for dizziness.  Endo/Heme/Allergies: Does not bruise/bleed easily.  Psychiatric/Behavioral: Negative for depression.    MEDICAL HISTORY:  Past Medical History:  Diagnosis Date  . Diabetes mellitus without complication ([place])   . History of diverticulitis   . Hypertension     SURGICAL HISTORY: Past Surgical History:  Procedure Laterality Date  . APPENDECTOMY  10/06/1990  . COLONOSCOPY  2006   Dr Vira Agar  . partial colon removed- diverticular disease  04/01/2011  . TONSILLECTOMY      SOCIAL HISTORY: Social History   Socioeconomic History  . Marital status: Married    Spouse name: Not on file  . Number of children: Not on file  . Years of education: Not on  file  . Highest education level: Not on file  Social Needs  . Financial resource strain: Not on file  . Food insecurity - worry: Not on file  . Food insecurity - inability: Not on file  . Transportation needs - medical: Not on file  . Transportation needs - non-medical: Not on file  Occupational History  . Not on file  Tobacco Use  . Smoking status: Former Smoker    Packs/day: 1.00    Types: Cigarettes    Last attempt to quit: 1981    Years since quitting: 37.9  . Smokeless tobacco: Former Systems developer    Types: [place] date: 2004  Substance and Sexual Activity  . Alcohol use: Yes    Alcohol/week: 0.0 oz    Comment: 2-3 a month  . Drug use: No  . Sexual activity: Yes  Other Topics Concern  . Not on file  Social History Narrative  . Not on file    FAMILY HISTORY: Family History  Problem Relation Age of Onset  . Esophageal cancer Father   . Heart disease Maternal Grandmother     ALLERGIES:  is allergic to penicillins.  MEDICATIONS:  Current Outpatient Medications  Medication Sig Dispense Refill  . amLODipine (NORVASC) 5 MG tablet Take 1 tablet (5 mg total) by mouth daily. 90 tablet 1  . glucagon (GLUCAGON EMERGENCY) 1 MG injection Inject as directed as directed.    . insulin aspart (NOVOLOG FLEXPEN) 100 UNIT/ML FlexPen Take 4-8u before lunch and supper directed    . Liraglutide (VICTOZA) 18 MG/3ML SOPN Inject 1.8 Units into the skin daily at 6 (  six) AM.    . lisinopril (PRINIVIL,ZESTRIL) 10 MG tablet Take 1 tablet (10 mg total) by mouth daily. 90 tablet 1  . metFORMIN (GLUCOPHAGE) 1000 MG tablet Take 1,000 mg 2 (two) times daily by mouth.    . Multiple Vitamin (MULTIVITAMIN) capsule Take 1 capsule daily by mouth.    Marland Kitchen glucose blood test strip 1 application daily.    . Insulin Pen Needle (BD ULTRA-FINE PEN NEEDLES) 29G X 12.7MM MISC Use as directed. Use 1 needle to skin twice a day as directed [Use with Levemir and Victoza Pens]    . Insulin Pen Needle (NOVOFINE) 32G X 6  MM MISC USE ONE NEEDLE TWICE DAILY AS DIRECTED     No current facility-administered medications for this visit.      PHYSICAL EXAMINATION: ECOG PERFORMANCE STATUS: 0 - Asymptomatic Vitals:   08/24/17 1434  BP: (!) 170/76  Pulse: 76  Temp: (!) 97 F (36.1 C)   Filed Weights   08/24/17 1434  Weight: 214 lb 2 oz (97.1 kg)    GENERAL: No distress, well nourished.  SKIN:  No rashes or significant lesions  HEAD: Normocephalic, No masses, lesions, tenderness or abnormalities  EYES: Conjunctiva are pink, non icteric ENT: External ears normal ,lips , buccal mucosa, and tongue normal and mucous membranes are moist  LYMPH: No palpable cervical and axillary lymphadenopathy  LUNGS: Clear to auscultation, no crackles or wheezes HEART: Regular rate & rhythm, no murmurs, no gallops, S1 normal and S2 normal  ABDOMEN: Abdomen soft, non-tender, normal bowel sounds, I did not appreciate any  masses or organomegaly  MUSCULOSKELETAL: No CVA tenderness and no tenderness on percussion of the back or rib cage.  EXTREMITIES: No edema, no skin discoloration or tenderness NEURO: Alert & oriented, no focal motor/sensory deficits.    LABORATORY DATA:  I have reviewed the data as listed Lab Results  Component Value Date   WBC 7.4 08/24/2017   HGB 12.9 (L) 08/24/2017   HCT 37.2 (L) 08/24/2017   MCV 95.2 08/24/2017   PLT 215 08/24/2017   Recent Labs    06/25/17 1103 08/24/17 1554  NA 139 135  K 5.3* 4.0  CL 106 107  CO2 18* 19*  GLUCOSE 129* 129*  BUN 25 30*  CREATININE 1.84* 2.02*  CALCIUM 10.4* 9.3  GFRNONAA 38* 33*  GFRAA 44* 38*  PROT  --  7.6  ALBUMIN 4.4 4.3  AST  --  23  ALT  --  25  ALKPHOS  --  43  BILITOT  --  1.0       ASSESSMENT & PLAN:  1. MGUS (monoclonal gammopathy of unknown significance)   2. Normocytic anemia     discuss with patient regarding lab results. He has a monoclonal protein spike on SPEP, most likely MGUS. I will order additional lab work including  repeating SPEP, immunofixation, immunoglobulin quantification, LDH, beta-2 microglobulin. And light chain ratio.   normocytic anemia, we will order iron TIBC ferritin, B12 and folate.  All questions were answered. The patient knows to call the clinic with any problems questions or concerns.  Return of visit:  2 weeks to discuss about lab results.  Thank you for this kind referral and the opportunity to participate in the care of this patient. A copy of today's note is routed to referring provider    Earlie Server, MD, PhD Hematology Oncology [facility] Pager- [phone] 08/24/2017

## 2017-08-25 LAB — KAPPA/LAMBDA LIGHT CHAINS
KAPPA FREE LGHT CHN: 47.1 mg/L — AB (ref 3.3–19.4)
KAPPA, LAMDA LIGHT CHAIN RATIO: 2 — AB (ref 0.26–1.65)
LAMDA FREE LIGHT CHAINS: 23.5 mg/L (ref 5.7–26.3)

## 2017-08-25 LAB — IMMUNOFIXATION ELECTROPHORESIS
IgA: 228 mg/dL (ref 61–437)
IgG (Immunoglobin G), Serum: 1096 mg/dL (ref 700–1600)
IgM (Immunoglobulin M), Srm: 44 mg/dL (ref 20–172)
TOTAL PROTEIN ELP: 6.9 g/dL (ref 6.0–8.5)

## 2017-08-25 LAB — PROTEIN ELECTROPHORESIS, SERUM
A/G RATIO SPE: 1.3 (ref 0.7–1.7)
ALBUMIN ELP: 3.8 g/dL (ref 2.9–4.4)
ALPHA-1-GLOBULIN: 0.2 g/dL (ref 0.0–0.4)
Alpha-2-Globulin: 0.7 g/dL (ref 0.4–1.0)
BETA GLOBULIN: 0.9 g/dL (ref 0.7–1.3)
Gamma Globulin: 1.2 g/dL (ref 0.4–1.8)
Globulin, Total: 3 g/dL (ref 2.2–3.9)
M-Spike, %: 0.4 g/dL — ABNORMAL HIGH
Total Protein ELP: 6.8 g/dL (ref 6.0–8.5)

## 2017-08-25 LAB — VITAMIN B12: Vitamin B-12: 562 pg/mL (ref 180–914)

## 2017-08-25 LAB — BETA 2 MICROGLOBULIN, SERUM: BETA 2 MICROGLOBULIN: 3 mg/L — AB (ref 0.6–2.4)

## 2017-08-26 LAB — IMMUNOGLOBULINS A/E/G/M, SERUM
IGE (IMMUNOGLOBULIN E), SERUM: 115 [IU]/mL — AB (ref 0–100)
IGG (IMMUNOGLOBIN G), SERUM: 1211 mg/dL (ref 700–1600)
IgA: 231 mg/dL (ref 61–437)
IgM (Immunoglobulin M), Srm: 45 mg/dL (ref 20–172)

## 2017-09-08 NOTE — Progress Notes (Addendum)
Hematology/Oncology Follow up note [facility] Telephone:(336) [phone] Fax:(336) [phone]   Patient Care Team: Juline Patch, MD as PCP - General (Family Medicine)  REFERRING PROVIDER: Dr.Lateef REASON FOR VISIT Follow up for treatment of MGUS    HISTORY OF PRESENTING ILLNESS:  Chris Blair. is a  66 y.o.  male with PMH listed below who was referred to me for evaluation of abnormal SPEP. Patient reports feeling well at his baseline. He recently had labs done with primary care physician and was noticed to have chronic kidney disease. patient was referred to see a nephrologist Dr. Holley Raring who did an additional test. Lab records from nephrologist office was reviewed. Was noted that serum protein electrophoresis showed M spike of 0.5.  Urine electrophoresis no M spike was detected. Patient denies any bone pain. He denies any neuropathy.   INTERVAL HISTORY:  Patient was accompanied by his wife to discuss about lab results and management.  He feels well. He has intermittent shoulder pain for which he takes tylenol PRN.   Review of Systems  Constitutional: Negative for chills and fever.  HENT: Negative for hearing loss.   Eyes: Negative for blurred vision.  Cardiovascular: Negative for chest pain.  Gastrointestinal: Negative for heartburn.  Genitourinary: Negative for dysuria.  Musculoskeletal: Negative for myalgias.       Intermittent shoulder pain.  Skin: Negative for rash.  Neurological: Negative for dizziness.  Endo/Heme/Allergies: Does not bruise/bleed easily.  Psychiatric/Behavioral: Negative for depression.    MEDICAL HISTORY:  Past Medical History:  Diagnosis Date  . Diabetes mellitus without complication ([place])   . History of diverticulitis   . Hypertension     SURGICAL HISTORY: Past Surgical History:  Procedure Laterality Date  . APPENDECTOMY  10/06/1990  . COLONOSCOPY  2006   Dr Vira Agar  . partial colon removed- diverticular disease   04/01/2011  . TONSILLECTOMY      SOCIAL HISTORY: Social History   Socioeconomic History  . Marital status: Married    Spouse name: Not on file  . Number of children: Not on file  . Years of education: Not on file  . Highest education level: Not on file  Social Needs  . Financial resource strain: Not on file  . Food insecurity - worry: Not on file  . Food insecurity - inability: Not on file  . Transportation needs - medical: Not on file  . Transportation needs - non-medical: Not on file  Occupational History  . Not on file  Tobacco Use  . Smoking status: Former Smoker    Packs/day: 1.00    Types: Cigarettes    Last attempt to quit: 1981    Years since quitting: 37.9  . Smokeless tobacco: Former Systems developer    Types: [place] date: 2004  Substance and Sexual Activity  . Alcohol use: Yes    Alcohol/week: 0.0 oz    Comment: 2-3 a month  . Drug use: No  . Sexual activity: Yes  Other Topics Concern  . Not on file  Social History Narrative  . Not on file    FAMILY HISTORY: Family History  Problem Relation Age of Onset  . Esophageal cancer Father   . Heart disease Maternal Grandmother     ALLERGIES:  is allergic to penicillins.  MEDICATIONS:  Current Outpatient Medications  Medication Sig Dispense Refill  . amLODipine (NORVASC) 5 MG tablet Take 1 tablet (5 mg total) by mouth daily. 90 tablet 1  . glucagon (GLUCAGON  EMERGENCY) 1 MG injection Inject as directed as directed.    Marland Kitchen glucose blood test strip 1 application daily.    . insulin aspart (NOVOLOG FLEXPEN) 100 UNIT/ML FlexPen Take 4-8u before lunch and supper directed    . Insulin Pen Needle (BD ULTRA-FINE PEN NEEDLES) 29G X 12.7MM MISC Use as directed. Use 1 needle to skin twice a day as directed [Use with Levemir and Victoza Pens]    . Insulin Pen Needle (NOVOFINE) 32G X 6 MM MISC USE ONE NEEDLE TWICE DAILY AS DIRECTED    . Liraglutide (VICTOZA) 18 MG/3ML SOPN Inject 1.8 Units into the skin daily at 6 (six) AM.      . lisinopril (PRINIVIL,ZESTRIL) 10 MG tablet Take 1 tablet (10 mg total) by mouth daily. 90 tablet 1  . metFORMIN (GLUCOPHAGE) 1000 MG tablet Take 1,000 mg 2 (two) times daily by mouth.    . Multiple Vitamin (MULTIVITAMIN) capsule Take 1 capsule daily by mouth.     No current facility-administered medications for this visit.      PHYSICAL EXAMINATION: ECOG PERFORMANCE STATUS: 0 - Asymptomatic Vitals:   09/09/17 1435  BP: (!) 156/67  Pulse: 61  Resp: 16  Temp: (!) 97.3 F (36.3 C)   Filed Weights   09/09/17 1435  Weight: 213 lb 14.4 oz (97 kg)   Physical Exam  Constitutional: He is oriented to person, place, and time and well-developed, well-nourished, and in no distress. No distress.  HENT:  Head: Normocephalic and atraumatic.  Eyes: No scleral icterus.  Neck: Normal range of motion. Neck supple.  Cardiovascular: Normal rate and regular rhythm.  Pulmonary/Chest: Effort normal and breath sounds normal.  Abdominal: Soft. Bowel sounds are normal. He exhibits no distension.  Musculoskeletal: He exhibits no edema.  Lymphadenopathy:    He has no cervical adenopathy.  Neurological: He is alert and oriented to person, place, and time.  Skin: Skin is dry.  Psychiatric: Affect normal.     LABORATORY DATA:  I have reviewed the data as listed Lab Results  Component Value Date   WBC 7.4 08/24/2017   HGB 12.9 (L) 08/24/2017   HCT 37.2 (L) 08/24/2017   MCV 95.2 08/24/2017   PLT 215 08/24/2017   Recent Labs    06/25/17 1103 08/24/17 1554  NA 139 135  K 5.3* 4.0  CL 106 107  CO2 18* 19*  GLUCOSE 129* 129*  BUN 25 30*  CREATININE 1.84* 2.02*  CALCIUM 10.4* 9.3  GFRNONAA 38* 33*  GFRAA 44* 38*  PROT  --  7.6  ALBUMIN 4.4 4.3  AST  --  23  ALT  --  25  ALKPHOS  --  43  BILITOT  --  1.0     ASSESSMENT & PLAN:  1. MGUS (monoclonal gammopathy of unknown significance)   2. Normocytic anemia    Lab results discussed with patient. He has IgG MGUS, low-intermediate  risk (1 risk factor: abnormal free light chain ratio). Currently he is asymptomatic. Discussed with patient and his wife about MGUS diagnosis and risk of progression to multiple myeloma.  It is not clear whether his chronic renal insufficiency is due to long standing HTN and diabetes or secondary to light chain kidney disease.  His UPEP done by nephrologists revealed no M spike was detected.  # check skeletal survey,  repeat UPEP and urine light chain.  # check NT-proBNP, urine albumin,to see if any clue of amyloidosis.  All questions were answered. The patient knows to call the  clinic with any problems questions or concerns.  Return of visit:  3 months with repeat myeloma labs done 10 days.  Thank you for this kind referral and the opportunity to participate in the care of this patient. A copy of today's note is routed to referring provider    Earlie Server, MD, PhD Hematology Oncology [facility] at [facility] Pager- [phone] 09/08/2017

## 2017-09-09 ENCOUNTER — Inpatient Hospital Stay: Payer: BLUE CROSS/BLUE SHIELD | Attending: Oncology | Admitting: Oncology

## 2017-09-09 ENCOUNTER — Encounter: Payer: Self-pay | Admitting: Oncology

## 2017-09-09 ENCOUNTER — Inpatient Hospital Stay: Payer: BLUE CROSS/BLUE SHIELD

## 2017-09-09 VITALS — BP 156/67 | HR 61 | Temp 97.3°F | Resp 16 | Wt 213.9 lb

## 2017-09-09 DIAGNOSIS — D649 Anemia, unspecified: Secondary | ICD-10-CM | POA: Diagnosis not present

## 2017-09-09 DIAGNOSIS — I129 Hypertensive chronic kidney disease with stage 1 through stage 4 chronic kidney disease, or unspecified chronic kidney disease: Secondary | ICD-10-CM

## 2017-09-09 DIAGNOSIS — Z87891 Personal history of nicotine dependence: Secondary | ICD-10-CM

## 2017-09-09 DIAGNOSIS — E1122 Type 2 diabetes mellitus with diabetic chronic kidney disease: Secondary | ICD-10-CM

## 2017-09-09 DIAGNOSIS — Z794 Long term (current) use of insulin: Secondary | ICD-10-CM | POA: Insufficient documentation

## 2017-09-09 DIAGNOSIS — M25519 Pain in unspecified shoulder: Secondary | ICD-10-CM | POA: Insufficient documentation

## 2017-09-09 DIAGNOSIS — Z79899 Other long term (current) drug therapy: Secondary | ICD-10-CM | POA: Diagnosis not present

## 2017-09-09 DIAGNOSIS — N189 Chronic kidney disease, unspecified: Secondary | ICD-10-CM | POA: Diagnosis not present

## 2017-09-09 DIAGNOSIS — D472 Monoclonal gammopathy: Secondary | ICD-10-CM

## 2017-09-09 DIAGNOSIS — Z880 Allergy status to penicillin: Secondary | ICD-10-CM

## 2017-09-09 NOTE — Progress Notes (Signed)
Patient here for follow up with lab results today. He states that he is feeling well and denies having any pain.

## 2017-09-10 ENCOUNTER — Ambulatory Visit
Admission: RE | Admit: 2017-09-10 | Discharge: 2017-09-10 | Disposition: A | Discharge status: Home / Self Care | Payer: BLUE CROSS/BLUE SHIELD | Source: Ambulatory Visit | Attending: Oncology | Admitting: Oncology

## 2017-09-10 DIAGNOSIS — D472 Monoclonal gammopathy: Secondary | ICD-10-CM | POA: Insufficient documentation

## 2017-09-10 DIAGNOSIS — D649 Anemia, unspecified: Secondary | ICD-10-CM | POA: Diagnosis present

## 2017-09-10 LAB — MISC LABCORP TEST (SEND OUT): LabCorp test name: 143000

## 2017-09-10 LAB — MICROALBUMIN / CREATININE URINE RATIO
CREATININE, UR: 53.1 mg/dL
MICROALB/CREAT RATIO: 24.5 mg/g{creat} (ref 0.0–30.0)
Microalb, Ur: 13 ug/mL — ABNORMAL HIGH

## 2017-09-10 NOTE — Addendum Note (Signed)
Addended by: Earlie Server on: 09/10/2017 10:53 AM   Modules accepted: Orders

## 2017-11-23 ENCOUNTER — Other Ambulatory Visit: Payer: Self-pay

## 2017-12-07 ENCOUNTER — Inpatient Hospital Stay: Payer: BLUE CROSS/BLUE SHIELD | Attending: Oncology

## 2017-12-07 DIAGNOSIS — N189 Chronic kidney disease, unspecified: Secondary | ICD-10-CM | POA: Insufficient documentation

## 2017-12-07 DIAGNOSIS — I129 Hypertensive chronic kidney disease with stage 1 through stage 4 chronic kidney disease, or unspecified chronic kidney disease: Secondary | ICD-10-CM | POA: Diagnosis not present

## 2017-12-07 DIAGNOSIS — Z87891 Personal history of nicotine dependence: Secondary | ICD-10-CM | POA: Diagnosis not present

## 2017-12-07 DIAGNOSIS — D649 Anemia, unspecified: Secondary | ICD-10-CM | POA: Diagnosis not present

## 2017-12-07 DIAGNOSIS — E1122 Type 2 diabetes mellitus with diabetic chronic kidney disease: Secondary | ICD-10-CM | POA: Diagnosis not present

## 2017-12-07 DIAGNOSIS — D472 Monoclonal gammopathy: Secondary | ICD-10-CM | POA: Insufficient documentation

## 2017-12-07 LAB — COMPREHENSIVE METABOLIC PANEL
ALK PHOS: 46 U/L (ref 38–126)
ALT: 18 U/L (ref 17–63)
AST: 19 U/L (ref 15–41)
Albumin: 4.1 g/dL (ref 3.5–5.0)
Anion gap: 6 (ref 5–15)
BILIRUBIN TOTAL: 0.5 mg/dL (ref 0.3–1.2)
BUN: 38 mg/dL — ABNORMAL HIGH (ref 6–20)
CO2: 22 mmol/L (ref 22–32)
CREATININE: 1.99 mg/dL — AB (ref 0.61–1.24)
Calcium: 9.5 mg/dL (ref 8.9–10.3)
Chloride: 108 mmol/L (ref 101–111)
GFR, EST AFRICAN AMERICAN: 39 mL/min — AB (ref >60)
GFR, EST NON AFRICAN AMERICAN: 34 mL/min — AB (ref >60)
Glucose, Bld: 105 mg/dL — ABNORMAL HIGH (ref 65–99)
Potassium: 4 mmol/L (ref 3.5–5.1)
Sodium: 136 mmol/L (ref 135–145)
TOTAL PROTEIN: 7.5 g/dL (ref 6.5–8.1)

## 2017-12-07 LAB — CBC WITH DIFFERENTIAL/PLATELET
Basophils Absolute: 0.1 10*3/uL (ref 0–0.1)
Basophils Relative: 1 %
Eosinophils Absolute: 0.2 10*3/uL (ref 0–0.7)
Eosinophils Relative: 2 %
HCT: 37 % — ABNORMAL LOW (ref 40.0–52.0)
HEMOGLOBIN: 12.7 g/dL — AB (ref 13.0–18.0)
LYMPHS ABS: 1.9 10*3/uL (ref 1.0–3.6)
LYMPHS PCT: 24 %
MCH: 32.7 pg (ref 26.0–34.0)
MCHC: 34.3 g/dL (ref 32.0–36.0)
MCV: 95.5 fL (ref 80.0–100.0)
MONOS PCT: 9 %
Monocytes Absolute: 0.7 10*3/uL (ref 0.2–1.0)
NEUTROS PCT: 64 %
Neutro Abs: 5.3 10*3/uL (ref 1.4–6.5)
Platelets: 206 10*3/uL (ref 150–440)
RBC: 3.87 MIL/uL — AB (ref 4.40–5.90)
RDW: 13.1 % (ref 11.5–14.5)
WBC: 8.2 10*3/uL (ref 3.8–10.6)

## 2017-12-08 ENCOUNTER — Other Ambulatory Visit: Payer: BLUE CROSS/BLUE SHIELD

## 2017-12-08 LAB — PROTEIN ELECTROPHORESIS, SERUM
A/G Ratio: 1.3 (ref 0.7–1.7)
ALBUMIN ELP: 3.8 g/dL (ref 2.9–4.4)
ALPHA-2-GLOBULIN: 0.7 g/dL (ref 0.4–1.0)
Alpha-1-Globulin: 0.2 g/dL (ref 0.0–0.4)
Beta Globulin: 0.9 g/dL (ref 0.7–1.3)
GLOBULIN, TOTAL: 3 g/dL (ref 2.2–3.9)
Gamma Globulin: 1.1 g/dL (ref 0.4–1.8)
M-SPIKE, %: 0.4 g/dL — AB
Total Protein ELP: 6.8 g/dL (ref 6.0–8.5)

## 2017-12-08 LAB — KAPPA/LAMBDA LIGHT CHAINS
KAPPA, LAMDA LIGHT CHAIN RATIO: 2.68 — AB (ref 0.26–1.65)
Kappa free light chain: 66.7 mg/L — ABNORMAL HIGH (ref 3.3–19.4)
LAMDA FREE LIGHT CHAINS: 24.9 mg/L (ref 5.7–26.3)

## 2017-12-15 NOTE — Progress Notes (Signed)
Hematology/Oncology Follow up note [facility] Telephone:(336) [phone] Fax:(336) [phone]   Patient Care Team: Juline Patch, MD as PCP - General (Family Medicine)  REFERRING PROVIDER: Dr.Lateef REASON FOR VISIT Follow up for treatment of MGUS    HISTORY OF PRESENTING ILLNESS:  Chris Eoff. is a  67 y.o.  male with PMH listed below who was referred to me for evaluation of abnormal SPEP. Patient reports feeling well at his baseline. He recently had labs done with primary care physician and was noticed to have chronic kidney disease. patient was referred to see a nephrologist Dr. Holley Raring who did an additional test. Lab records from nephrologist office was reviewed. Was noted that serum protein electrophoresis showed M spike of 0.5.  Urine electrophoresis no M spike was detected. Patient denies any bone pain. He denies any neuropathy.   INTERVAL HISTORY:  Patient presents for follow up. He reports no new complaints. He is going to retire and [place].   He feels well.    Review of Systems  Constitutional: Negative for chills, fever and malaise/fatigue.  HENT: Negative for hearing loss and nosebleeds.   Eyes: Negative for blurred vision, photophobia and pain.  Respiratory: Negative for cough.   Cardiovascular: Negative for chest pain, orthopnea and claudication.  Gastrointestinal: Negative for abdominal pain, diarrhea, heartburn and vomiting.  Genitourinary: Negative for dysuria and frequency.  Musculoskeletal: Negative for myalgias and neck pain.  Skin: Negative for rash.  Neurological: Negative for dizziness, tremors and sensory change.  Endo/Heme/Allergies: Does not bruise/bleed easily.  Psychiatric/Behavioral: Negative for depression and substance abuse. The patient is not nervous/anxious.     MEDICAL HISTORY:  Past Medical History:  Diagnosis Date  . Diabetes mellitus without complication ([place])   . History of diverticulitis     . Hypertension     SURGICAL HISTORY: Past Surgical History:  Procedure Laterality Date  . APPENDECTOMY  10/06/1990  . COLONOSCOPY  2006   Dr Vira Agar  . partial colon removed- diverticular disease  04/01/2011  . TONSILLECTOMY      SOCIAL HISTORY: Social History   Socioeconomic History  . Marital status: Married    Spouse name: Not on file  . Number of children: Not on file  . Years of education: Not on file  . Highest education level: Not on file  Social Needs  . Financial resource strain: Not on file  . Food insecurity - worry: Not on file  . Food insecurity - inability: Not on file  . Transportation needs - medical: Not on file  . Transportation needs - non-medical: Not on file  Occupational History  . Not on file  Tobacco Use  . Smoking status: Former Smoker    Packs/day: 1.00    Types: Cigarettes    Last attempt to quit: 1981    Years since quitting: 38.2  . Smokeless tobacco: Former Systems developer    Types: [place] date: 2004  Substance and Sexual Activity  . Alcohol use: Yes    Alcohol/week: 0.0 oz    Comment: 2-3 a month  . Drug use: No  . Sexual activity: Yes  Other Topics Concern  . Not on file  Social History Narrative  . Not on file    FAMILY HISTORY: Family History  Problem Relation Age of Onset  . Esophageal cancer Father   . Heart disease Maternal Grandmother     ALLERGIES:  is allergic to penicillins.  MEDICATIONS:  Current  Outpatient Medications  Medication Sig Dispense Refill  . amLODipine (NORVASC) 5 MG tablet Take 1 tablet (5 mg total) by mouth daily. 90 tablet 1  . glucagon (GLUCAGON EMERGENCY) 1 MG injection Inject as directed as directed.    Marland Kitchen glucose blood test strip 1 application daily.    . insulin aspart (NOVOLOG FLEXPEN) 100 UNIT/ML FlexPen Take 4-8u before lunch and supper directed    . Insulin Pen Needle (BD ULTRA-FINE PEN NEEDLES) 29G X 12.7MM MISC Use as directed. Use 1 needle to skin twice a day as directed [Use with Levemir  and Victoza Pens]    . Insulin Pen Needle (NOVOFINE) 32G X 6 MM MISC USE ONE NEEDLE TWICE DAILY AS DIRECTED    . Liraglutide (VICTOZA) 18 MG/3ML SOPN Inject 1.8 Units into the skin daily at 6 (six) AM.    . lisinopril (PRINIVIL,ZESTRIL) 10 MG tablet Take 1 tablet (10 mg total) by mouth daily. 90 tablet 1  . metFORMIN (GLUCOPHAGE) 1000 MG tablet Take 1,000 mg 2 (two) times daily by mouth.    . Multiple Vitamin (MULTIVITAMIN) capsule Take 1 capsule daily by mouth.     No current facility-administered medications for this visit.      PHYSICAL EXAMINATION: ECOG PERFORMANCE STATUS: 0 - Asymptomatic Vitals:   12/16/17 1414  BP: (!) 161/75  Pulse: 62  Resp: 18  Temp: 97.6 F (36.4 C)   Filed Weights   12/16/17 1414  Weight: 200 lb (90.7 kg)   Physical Exam  Constitutional: He is oriented to person, place, and time and well-developed, well-nourished, and in no distress. No distress.  HENT:  Head: Normocephalic and atraumatic.  Mouth/Throat: Oropharynx is clear and moist. No oropharyngeal exudate.  Eyes: No scleral icterus.  Neck: Normal range of motion. Neck supple.  Cardiovascular: Normal rate and regular rhythm.  Pulmonary/Chest: Effort normal and breath sounds normal. No respiratory distress.  Abdominal: Soft. Bowel sounds are normal. He exhibits no distension. There is no rebound and no guarding.  Musculoskeletal: He exhibits no edema.  Lymphadenopathy:    He has no cervical adenopathy.  Neurological: He is alert and oriented to person, place, and time. No cranial nerve deficit. Coordination normal.  Skin: Skin is dry. No erythema.  Psychiatric: Affect and judgment normal.     LABORATORY DATA:  I have reviewed the data as listed Lab Results  Component Value Date   WBC 8.2 12/07/2017   HGB 12.7 (L) 12/07/2017   HCT 37.0 (L) 12/07/2017   MCV 95.5 12/07/2017   PLT 206 12/07/2017   Recent Labs    06/25/17 1103 08/24/17 1554 12/07/17 1600  NA 139 135 136  K 5.3* 4.0  4.0  CL 106 107 108  CO2 18* 19* 22  GLUCOSE 129* 129* 105*  BUN 25 30* 38*  CREATININE 1.84* 2.02* 1.99*  CALCIUM 10.4* 9.3 9.5  GFRNONAA 38* 33* 34*  GFRAA 44* 38* 39*  PROT  --  7.6 7.5  ALBUMIN 4.4 4.3 4.1  AST  --  23 19  ALT  --  25 18  ALKPHOS  --  43 46  BILITOT  --  1.0 0.5     ASSESSMENT & PLAN:  1. MGUS (monoclonal gammopathy of unknown significance)   2. Normocytic anemia    Lab results discussed with patient. He has IgG MGUS, low-intermediate risk (1 risk factor: abnormal free light chain ratio). Currently he is asymptomatic.  His M protein is stable. Discussed with patient that he needs twice a  year lab follow up.  After he relocates, recommend him to find a local oncologist to follow his labs.   His chronic renal insufficiency is due to long standing HTN and diabetes His UPEP done by nephrologists revealed no M spike was detected.  # negative skeletal survey,  All questions were answered. The patient knows to call the clinic with any problems questions or concerns.  Return of visit:  6 months.     Earlie Server, MD, PhD Hematology Oncology [facility] at [facility] Pager- [phone] 12/16/2017

## 2017-12-16 ENCOUNTER — Inpatient Hospital Stay (HOSPITAL_BASED_OUTPATIENT_CLINIC_OR_DEPARTMENT_OTHER): Payer: BLUE CROSS/BLUE SHIELD | Admitting: Oncology

## 2017-12-16 ENCOUNTER — Encounter: Payer: Self-pay | Admitting: Oncology

## 2017-12-16 VITALS — BP 161/75 | HR 62 | Temp 97.6°F | Resp 18 | Ht 72.0 in | Wt 200.0 lb

## 2017-12-16 DIAGNOSIS — D472 Monoclonal gammopathy: Secondary | ICD-10-CM

## 2017-12-16 DIAGNOSIS — I129 Hypertensive chronic kidney disease with stage 1 through stage 4 chronic kidney disease, or unspecified chronic kidney disease: Secondary | ICD-10-CM

## 2017-12-16 DIAGNOSIS — N189 Chronic kidney disease, unspecified: Secondary | ICD-10-CM

## 2017-12-16 DIAGNOSIS — D649 Anemia, unspecified: Secondary | ICD-10-CM

## 2017-12-16 DIAGNOSIS — N183 Chronic kidney disease, stage 3 unspecified: Secondary | ICD-10-CM

## 2017-12-16 DIAGNOSIS — Z87891 Personal history of nicotine dependence: Secondary | ICD-10-CM | POA: Diagnosis not present

## 2017-12-16 DIAGNOSIS — E1122 Type 2 diabetes mellitus with diabetic chronic kidney disease: Secondary | ICD-10-CM

## 2017-12-16 NOTE — Progress Notes (Signed)
No concerns today 

## 2018-02-04 ENCOUNTER — Other Ambulatory Visit: Payer: Self-pay | Admitting: Family Medicine

## 2018-02-04 DIAGNOSIS — I10 Essential (primary) hypertension: Secondary | ICD-10-CM

## 2018-03-16 ENCOUNTER — Other Ambulatory Visit: Payer: Self-pay

## 2018-03-16 DIAGNOSIS — I10 Essential (primary) hypertension: Secondary | ICD-10-CM

## 2018-03-16 MED ORDER — AMLODIPINE BESYLATE 5 MG PO TABS: 5.0000 mg | ORAL_TABLET | Freq: Every day | ORAL | 2 refills | Status: AC

## 2018-03-16 MED ORDER — LISINOPRIL 10 MG PO TABS: 10.0000 mg | ORAL_TABLET | Freq: Every day | ORAL | 2 refills | Status: AC

## 2018-06-16 ENCOUNTER — Inpatient Hospital Stay: Payer: Self-pay | Attending: Oncology

## 2018-06-23 ENCOUNTER — Inpatient Hospital Stay: Payer: Self-pay | Admitting: Oncology

## 2019-07-23 IMAGING — CR DG BONE SURVEY MET
1 series · 8 of 8 positions shown · non-contrast
Comparison: No recent prior.

CLINICAL DATA: Monoclonal gammopathy.

EXAM:
METASTATIC BONE SURVEY

[Series 1: dg bone survey met · 0.14mm/px · 8 of 22 slices shown]
[im 1/22]
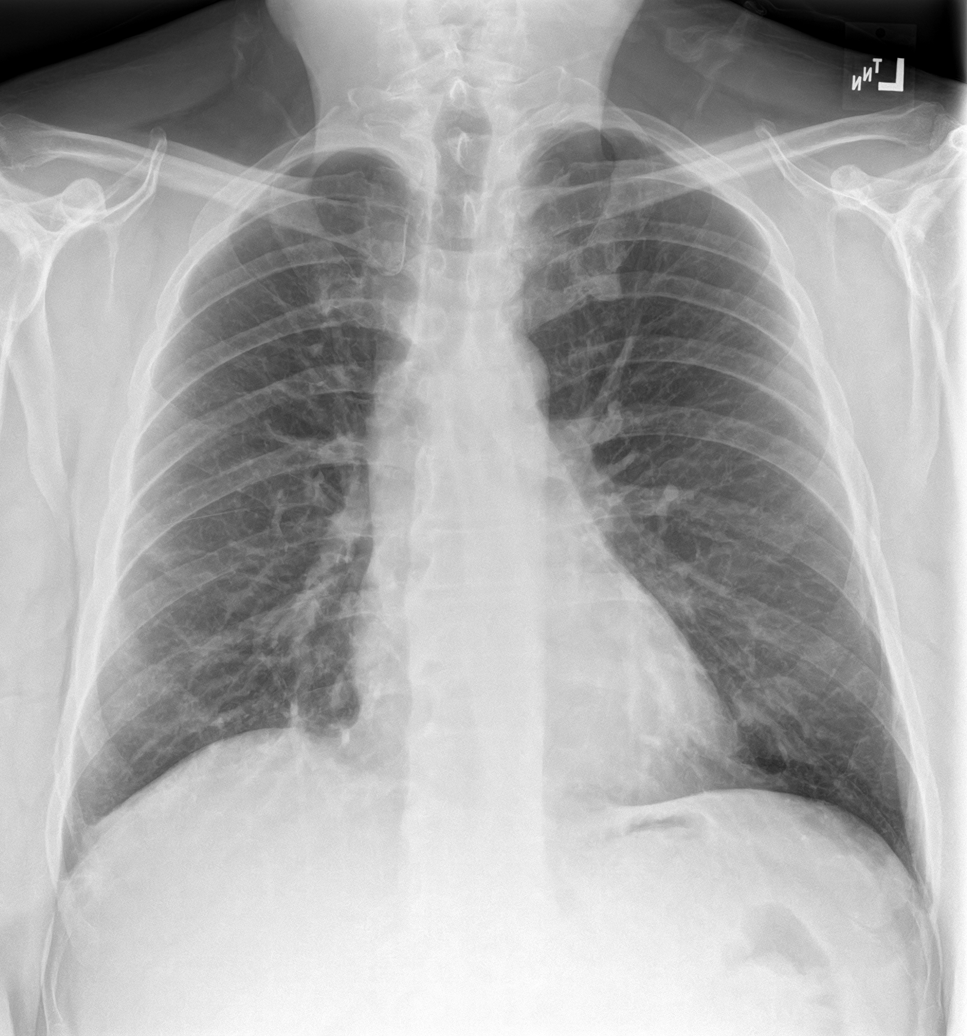
[im 4/22]
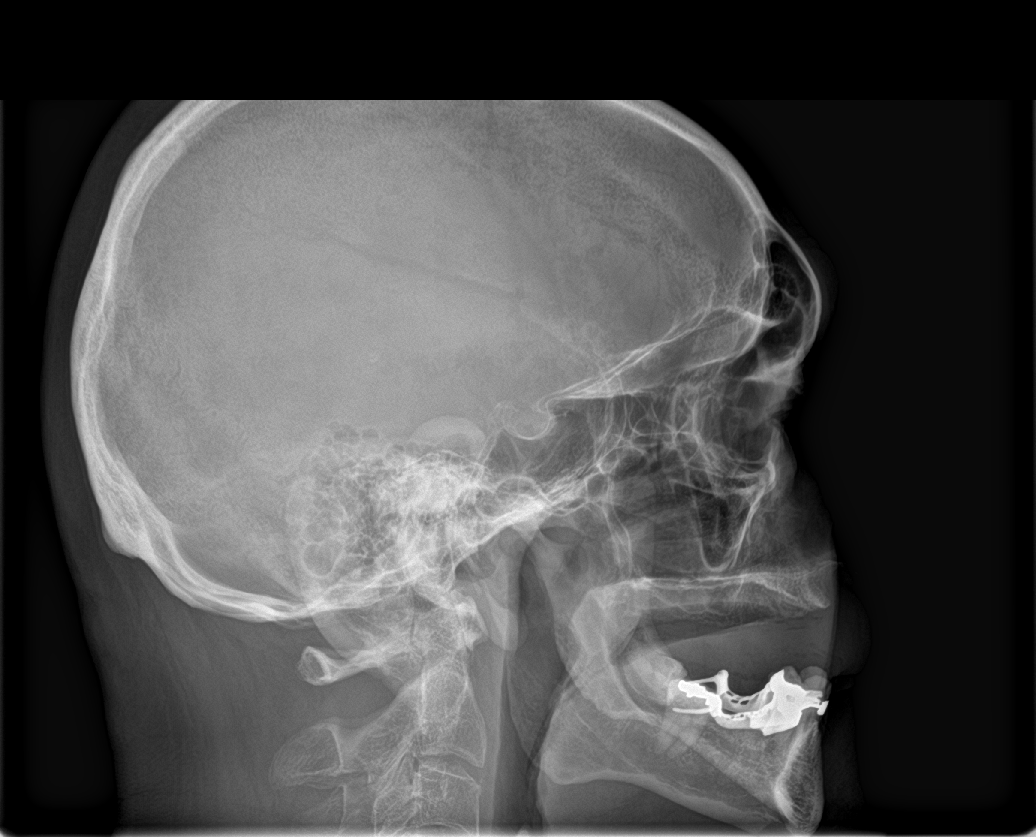
[im 7/22]
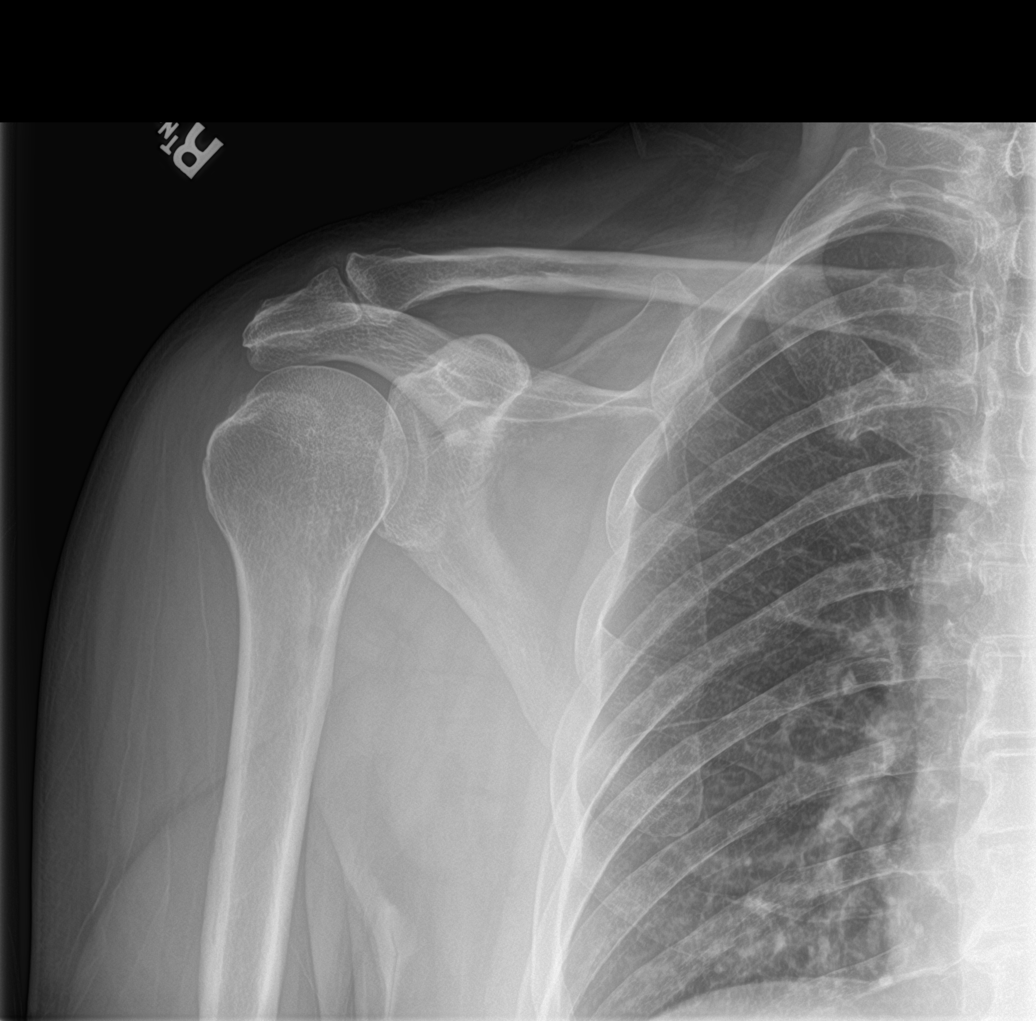
[im 10/22]
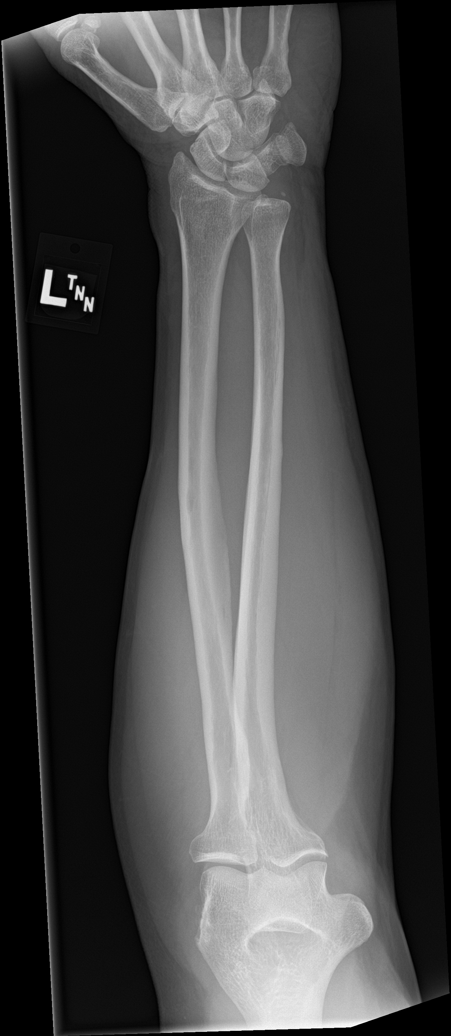
[im 13/22]
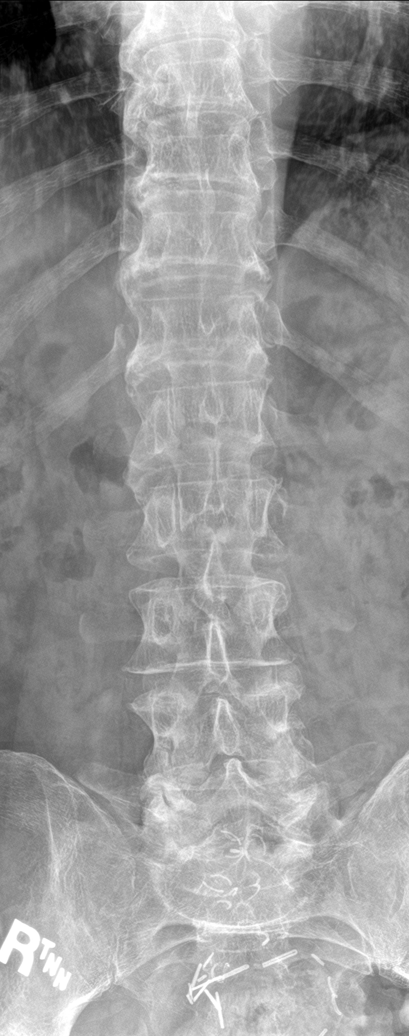
[im 16/22]
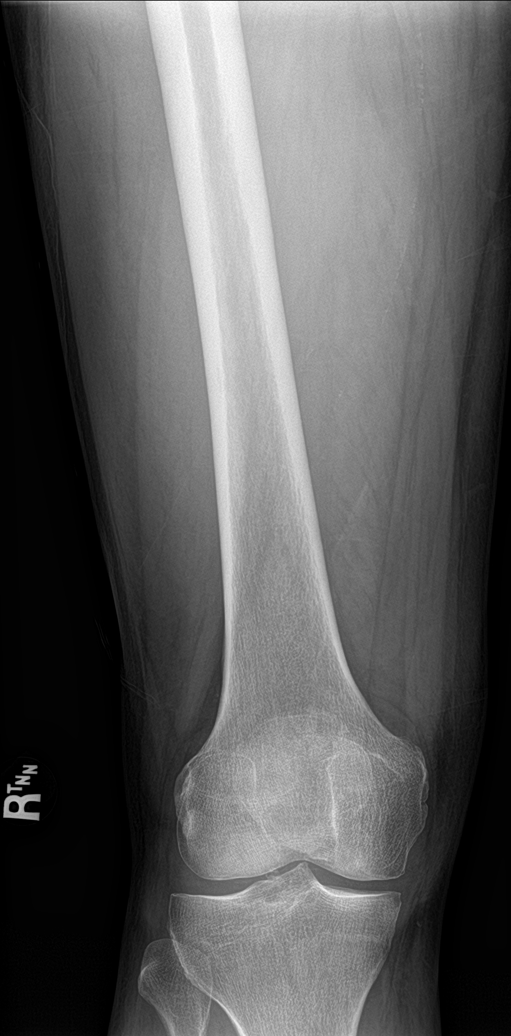
[im 19/22]
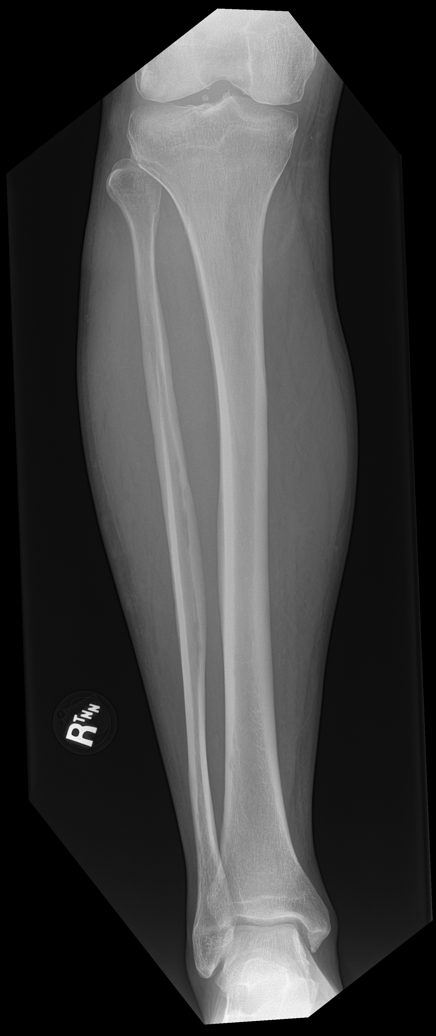
[im 22/22]
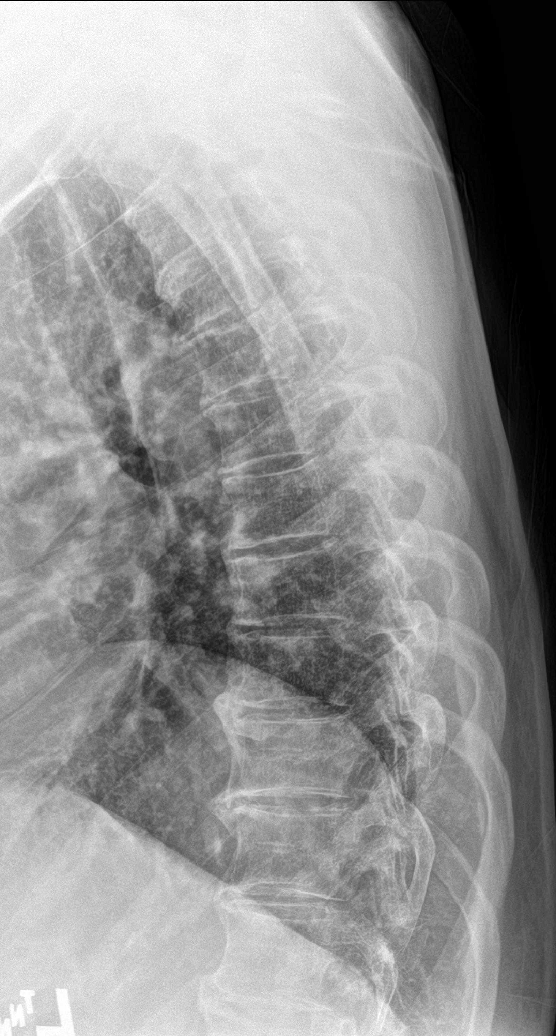

[8 of 8 positions shown; findings below may reference images not displayed]

FINDINGS: Imaging of the axial and appendicular skeleton performed. No lytic
or blastic lesions identified. No acute bony or joint abnormality
identified. Diffuse osteopenia noted. Diffuse degenerative change
noted throughout the spine. 4 mm anterolisthesis L4 on L5 noted.
Surgical clips in the pelvis. Carotid vascular a peripheral vascular
calcification consistent atherosclerotic vascular disease noted.
IMPRESSION: No lytic or blastic abnormalities identified. No evidence of myeloma
or metastatic disease.
